# Patient Record
Sex: Male | Born: 1965 | Race: White | Hispanic: No | Smoking: Never smoker
Health system: Southern US, Community
[De-identification: ages and names within clinical notes are randomized; demographics above are authoritative.]

---

## 2016-02-28 ENCOUNTER — Emergency Department (HOSPITAL_BASED_OUTPATIENT_CLINIC_OR_DEPARTMENT_OTHER)
Admission: EM | Admit: 2016-02-28 | Discharge: 2016-02-28 | Disposition: A | Discharge status: Home / Self Care | Payer: BLUE CROSS/BLUE SHIELD | Attending: Emergency Medicine | Admitting: Emergency Medicine

## 2016-02-28 ENCOUNTER — Encounter (HOSPITAL_BASED_OUTPATIENT_CLINIC_OR_DEPARTMENT_OTHER): Payer: Self-pay | Admitting: *Deleted

## 2016-02-28 ENCOUNTER — Emergency Department (HOSPITAL_BASED_OUTPATIENT_CLINIC_OR_DEPARTMENT_OTHER): Payer: BLUE CROSS/BLUE SHIELD

## 2016-02-28 DIAGNOSIS — Y998 Other external cause status: Secondary | ICD-10-CM | POA: Insufficient documentation

## 2016-02-28 DIAGNOSIS — Z23 Encounter for immunization: Secondary | ICD-10-CM | POA: Insufficient documentation

## 2016-02-28 DIAGNOSIS — Y9389 Activity, other specified: Secondary | ICD-10-CM | POA: Insufficient documentation

## 2016-02-28 DIAGNOSIS — W228XXA Striking against or struck by other objects, initial encounter: Secondary | ICD-10-CM | POA: Insufficient documentation

## 2016-02-28 DIAGNOSIS — Y92480 Sidewalk as the place of occurrence of the external cause: Secondary | ICD-10-CM | POA: Diagnosis not present

## 2016-02-28 DIAGNOSIS — S80811A Abrasion, right lower leg, initial encounter: Secondary | ICD-10-CM | POA: Insufficient documentation

## 2016-02-28 DIAGNOSIS — S8991XA Unspecified injury of right lower leg, initial encounter: Secondary | ICD-10-CM | POA: Diagnosis present

## 2016-02-28 DIAGNOSIS — S70311A Abrasion, right thigh, initial encounter: Secondary | ICD-10-CM | POA: Insufficient documentation

## 2016-02-28 DIAGNOSIS — Y9289 Other specified places as the place of occurrence of the external cause: Secondary | ICD-10-CM | POA: Insufficient documentation

## 2016-02-28 MED ORDER — BACITRACIN ZINC 500 UNIT/GM EX OINT
1.0000 "application " | TOPICAL_OINTMENT | Freq: Once | CUTANEOUS | Status: AC
  Administered 2016-02-28: 1 via TOPICAL

## 2016-02-28 MED ORDER — TETANUS-DIPHTH-ACELL PERTUSSIS 5-2.5-18.5 LF-MCG/0.5 IM SUSP
0.5000 mL | Freq: Once | INTRAMUSCULAR | Status: AC
  Administered 2016-02-28: 0.5 mL via INTRAMUSCULAR
  Filled 2016-02-28: qty 0.5

## 2016-02-28 NOTE — Discharge Instructions (Signed)
Abrasion An abrasion is a cut or scrape on the outer surface of your skin. An abrasion does not extend through all of the layers of your skin. It is important to care for your abrasion properly to prevent infection. CAUSES Most abrasions are caused by falling on or gliding across the ground or another surface. When your skin rubs on something, the outer and inner layer of skin rubs off.  SYMPTOMS A cut or scrape is the main symptom of this condition. The scrape may be bleeding, or it may appear red or pink. If there was an associated fall, there may be an underlying bruise. DIAGNOSIS An abrasion is diagnosed with a physical exam. TREATMENT Treatment for this condition depends on how large and deep the abrasion is. Usually, your abrasion will be cleaned with water and mild soap. This removes any dirt or debris that may be stuck. An antibiotic ointment may be applied to the abrasion to help prevent infection. A bandage (dressing) may be placed on the abrasion to keep it clean. You may also need a tetanus shot. HOME CARE INSTRUCTIONS Medicines  Take or apply medicines only as directed by your health care provider.  If you were prescribed an antibiotic ointment, finish all of it even if you start to feel better. Wound Care  Clean the wound with mild soap and water 2-3 times per day or as directed by your health care provider. Pat your wound dry with a clean towel. Do not rub it.  There are many different ways to close and cover a wound. Follow instructions from your health care provider about:  Wound care.  Dressing changes and removal.  Check your wound every day for signs of infection. Watch for:  Redness, swelling, or pain.  Fluid, blood, or pus. General Instructions  Keep the dressing dry as directed by your health care provider. Do not take baths, swim, use a hot tub, or do anything that would put your wound underwater until your health care provider approves.  If there is  swelling, raise (elevate) the injured area above the level of your heart while you are sitting or lying down.  Keep all follow-up visits as directed by your health care provider. This is important. SEEK MEDICAL CARE IF:  You received a tetanus shot and you have swelling, severe pain, redness, or bleeding at the injection site.  Your pain is not controlled with medicine.  You have increased redness, swelling, or pain at the site of your wound. SEEK IMMEDIATE MEDICAL CARE IF:  You have a red streak going away from your wound.  You have a fever.  You have fluid, blood, or pus coming from your wound.  You notice a bad smell coming from your wound or your dressing.   This information is not intended to replace advice given to you by your health care provider. Make sure you discuss any questions you have with your health care provider.   Document Released: 09/17/2005 Document Revised: 08/29/2015 Document Reviewed: 12/06/2014 Elsevier Interactive Patient Education 2016 [employer]  

## 2016-02-28 NOTE — ED Notes (Signed)
Pt has normal movement of RLE and able to move foot w/o difficulty, normal sensation and color to Rt lower extremity as well

## 2016-02-28 NOTE — ED Notes (Signed)
Pt teaching done re: to strict handwashing, keeping dsg clean dry and intact. Also provided pt with dsg supplies to help at home. Discussed times when to return to ED

## 2016-02-28 NOTE — ED Notes (Signed)
Dsg applied to rt calf area with Telfa pads and Kerlix, to rt medial malleolus with Kerlix and non-adhesive bandage, dsg also applied to rt medial ankle injury as well with Kerlix and non-adhesive pad also.
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
## 2016-02-28 NOTE — ED Notes (Signed)
Pt returns from radiology. 

## 2016-02-28 NOTE — ED Notes (Signed)
Patient transported to X-ray 

## 2016-02-28 NOTE — ED Notes (Signed)
MD at bedside. 
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
## 2016-12-26 ENCOUNTER — Emergency Department (HOSPITAL_BASED_OUTPATIENT_CLINIC_OR_DEPARTMENT_OTHER): Payer: BLUE CROSS/BLUE SHIELD

## 2016-12-26 ENCOUNTER — Encounter (HOSPITAL_BASED_OUTPATIENT_CLINIC_OR_DEPARTMENT_OTHER): Payer: Self-pay | Admitting: Emergency Medicine

## 2016-12-26 ENCOUNTER — Emergency Department (HOSPITAL_BASED_OUTPATIENT_CLINIC_OR_DEPARTMENT_OTHER)
Admission: EM | Admit: 2016-12-26 | Discharge: 2016-12-26 | Disposition: A | Discharge status: Home / Self Care | Payer: BLUE CROSS/BLUE SHIELD | Attending: Emergency Medicine | Admitting: Emergency Medicine

## 2016-12-26 DIAGNOSIS — J069 Acute upper respiratory infection, unspecified: Secondary | ICD-10-CM | POA: Insufficient documentation

## 2016-12-26 DIAGNOSIS — B9789 Other viral agents as the cause of diseases classified elsewhere: Secondary | ICD-10-CM

## 2016-12-26 DIAGNOSIS — R05 Cough: Secondary | ICD-10-CM | POA: Diagnosis present

## 2016-12-26 MED ORDER — BENZONATATE 100 MG PO CAPS
100.0000 mg | ORAL_CAPSULE | Freq: Once | ORAL | Status: AC
  Administered 2016-12-26: 100 mg via ORAL
  Filled 2016-12-26: qty 1

## 2016-12-26 MED ORDER — BENZONATATE 100 MG PO CAPS: 100.0000 mg | ORAL_CAPSULE | Freq: Three times a day (TID) | ORAL | 0 refills | Status: AC

## 2016-12-26 MED ORDER — FLUTICASONE PROPIONATE 50 MCG/ACT NA SUSP: 2.0000 | Freq: Every day | NASAL | 0 refills | Status: AC

## 2016-12-26 NOTE — ED Provider Notes (Signed)
[facility]   CSN: [id number] Arrival date & time: 12/26/16  0941     History   Chief Complaint Chief Complaint  Patient presents with  . Cough    HPI Benjamin Atkins is a 51 y.o. male.  51 year old Caucasian male with no significant past medical history presents to the ED today with multiple complaints. Patient states that approximate 5 days ago he woke up with body aches, fever, chills. States that he took Alka-Seltzer cold and flu and felt like he broke his fever. Patient also endorses rhinorrhea, sore throat, productive cough. States that he was feeling improved until this morning when his temperature was 100.2. He is concerned that he has a sinus infection thinks that he needs antibiotics. Patient endorses sick contacts. States that he is coughing up yellow-brown sputum. Nasal discharge is clear to yellow. He reports sinus pressure. Denies any headache, vision changes, neck pain, lightheadedness, dizziness, shortness of breath, chest pain, nausea, emesis, abdominal pain. Patient denies getting a flu shot this year. Denies being a tobacco user.      History reviewed. No pertinent past medical history.  There are no active problems to display for this patient.   History reviewed. No pertinent surgical history.     Home Medications    Prior to Admission medications   Not on File    Family History No family history on file.  Social History Social History  Substance Use Topics  . Smoking status: Never Smoker  . Smokeless tobacco: Never Used  . Alcohol use No     Allergies   Patient has no known allergies.   Review of Systems Review of Systems  Constitutional: Positive for chills and fever.  HENT: Positive for congestion, postnasal drip, rhinorrhea, sinus pressure and sore throat.   Eyes: Negative for visual disturbance.  Respiratory: Positive for cough. Negative for shortness of breath and wheezing.   Cardiovascular: Negative for  chest pain.  Gastrointestinal: Negative for abdominal pain, diarrhea, nausea and vomiting.  Musculoskeletal: Negative.   Neurological: Negative for dizziness, weakness, light-headedness, numbness and headaches.  All other systems reviewed and are negative.    Physical Exam Updated Vital Signs BP 143/78 (BP Location: Left Arm)   Pulse 95   Temp 98.1 F (36.7 C) (Oral)   Resp 18   Ht 6' (1.829 m)   Wt 84.8 kg   SpO2 100%   BMI 25.36 kg/m   Physical Exam  Constitutional: He appears well-developed and well-nourished. No distress.  HENT:  Head: Normocephalic and atraumatic.  Right Ear: Tympanic membrane, external ear and ear canal normal.  Left Ear: Tympanic membrane, external ear and ear canal normal.  Nose: Mucosal edema and rhinorrhea present. Right sinus exhibits no maxillary sinus tenderness and no frontal sinus tenderness. Left sinus exhibits no maxillary sinus tenderness and no frontal sinus tenderness.  Mouth/Throat: Uvula is midline and mucous membranes are normal. Posterior oropharyngeal erythema present. No oropharyngeal exudate, posterior oropharyngeal edema or tonsillar abscesses. Tonsils are 0 on the right. Tonsils are 0 on the left. No tonsillar exudate.  Eyes: Conjunctivae are normal. Pupils are equal, round, and reactive to light. Right eye exhibits no discharge. Left eye exhibits no discharge. No scleral icterus.  Neck: Normal range of motion. Neck supple. No thyromegaly present.  Cardiovascular: Normal rate, regular rhythm, normal heart sounds and intact distal pulses.  Exam reveals no gallop and no friction rub.   No murmur heard. Pulmonary/Chest: Effort normal and breath sounds normal.  Lungs  clear to auscultation bilaterally  Musculoskeletal: Normal range of motion.  Lymphadenopathy:    He has no cervical adenopathy.  Neurological: He is alert.  Skin: Skin is warm and dry. Capillary refill takes less than 2 seconds.  Nursing note and vitals reviewed.    ED  Treatments / Results  Labs (all labs ordered are listed, but only abnormal results are displayed) Labs Reviewed - No data to display  EKG  EKG Interpretation None       Radiology Dg Chest 2 View  Result Date: 12/26/2016 CLINICAL DATA:  51 year old male with productive cough and fever EXAM: CHEST  2 VIEW COMPARISON:  None. FINDINGS: The lungs are clear and negative for focal airspace consolidation, pulmonary edema or suspicious pulmonary nodule. No pleural effusion or pneumothorax. Cardiac and mediastinal contours are within normal limits. No acute fracture or lytic or blastic osseous lesions. The visualized upper abdominal bowel gas pattern is unremarkable. IMPRESSION: Negative chest x-ray. Electronically Signed   By: Malachy Moan M.D.   On: 12/26/2016 10:16    Procedures Procedures (including critical care time)  Medications Ordered in ED Medications  benzonatate (TESSALON) capsule 100 mg (not administered)     Initial Impression / Assessment and Plan / ED Course  I have reviewed the triage vital signs and the nursing notes.  Pertinent labs & imaging results that were available during my care of the patient were reviewed by me and considered in my medical decision making (see chart for details).  Clinical Course   Pt CXR negative for acute infiltrate. Patients symptoms are consistent with URI, likely viral etiology. Denies sob or cp. Doubt ACS or PE.  Discussed that antibiotics are not indicated for viral infections. Influenza possible diagnoses. Symptoms have been ongoing for 5 days. Out of the window for Tamiflu. Discussed that flu swab was not indicated. Stable VS.  Patient is nontoxic appearing. Pt will be discharged with symptomatic treatment.  Verbalizes understanding and is agreeable with plan. Pt is hemodynamically stable & in NAD prior to dc.   Final Clinical Impressions(s) / ED Diagnoses   Final diagnoses:  Viral URI with cough    New Prescriptions New  Prescriptions   BENZONATATE (TESSALON) 100 MG CAPSULE    Take 1 capsule (100 mg total) by mouth every 8 (eight) hours.   FLUTICASONE (FLONASE) 50 MCG/ACT NASAL SPRAY    Place 2 sprays into both nostrils daily.     Rise Mu, PA-C 12/26/16 1041    Alvira Monday, MD 12/27/16 [phone]

## 2016-12-26 NOTE — ED Triage Notes (Signed)
Onset x 5 days  Fever   Cough..productive

## 2016-12-26 NOTE — Discharge Instructions (Signed)
Your x-ray does not show any signs of pneumonia. This is likely a viral infection and antibiotics are not indicated at this time. If your symptoms last for longer than 10 days need to be reevaluated for possible need for antibiotics. Please take the Tessalon for cough. Use the Flonase for nasal congestion. I would also recommend getting over-the-counter Mucinex DM with a nasal decongestant. Follow-up with her primary care doctor if her symptoms do not improve. Return to ED if he develops any worsening symptoms.

## 2017-06-06 IMAGING — CR DG CHEST 2V
2 series · 2 of 2 positions shown · non-contrast
Comparison: None.

CLINICAL DATA: 50-year-old male with productive cough and fever

EXAM:
CHEST  2 VIEW

[w chest pa]
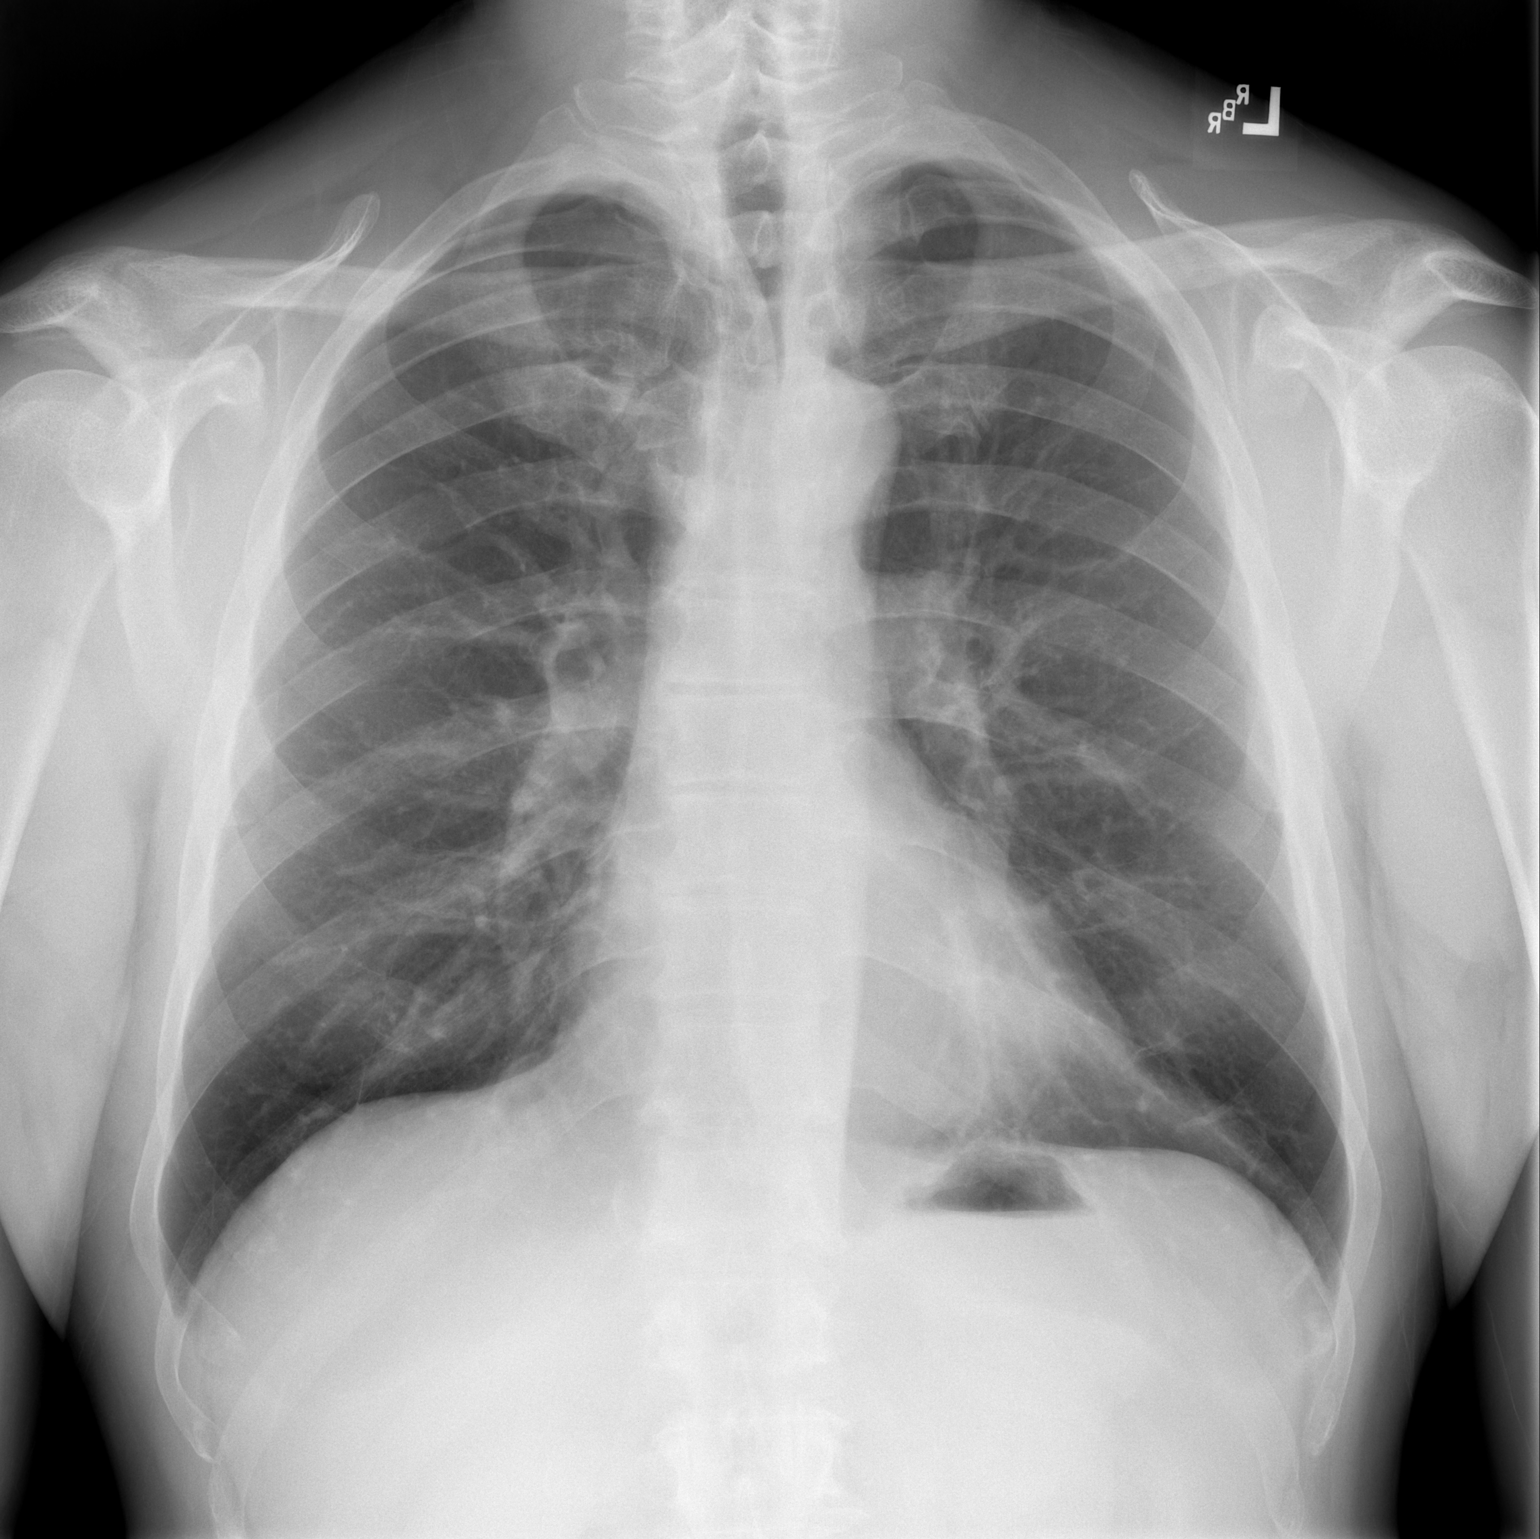

[w chest lat]
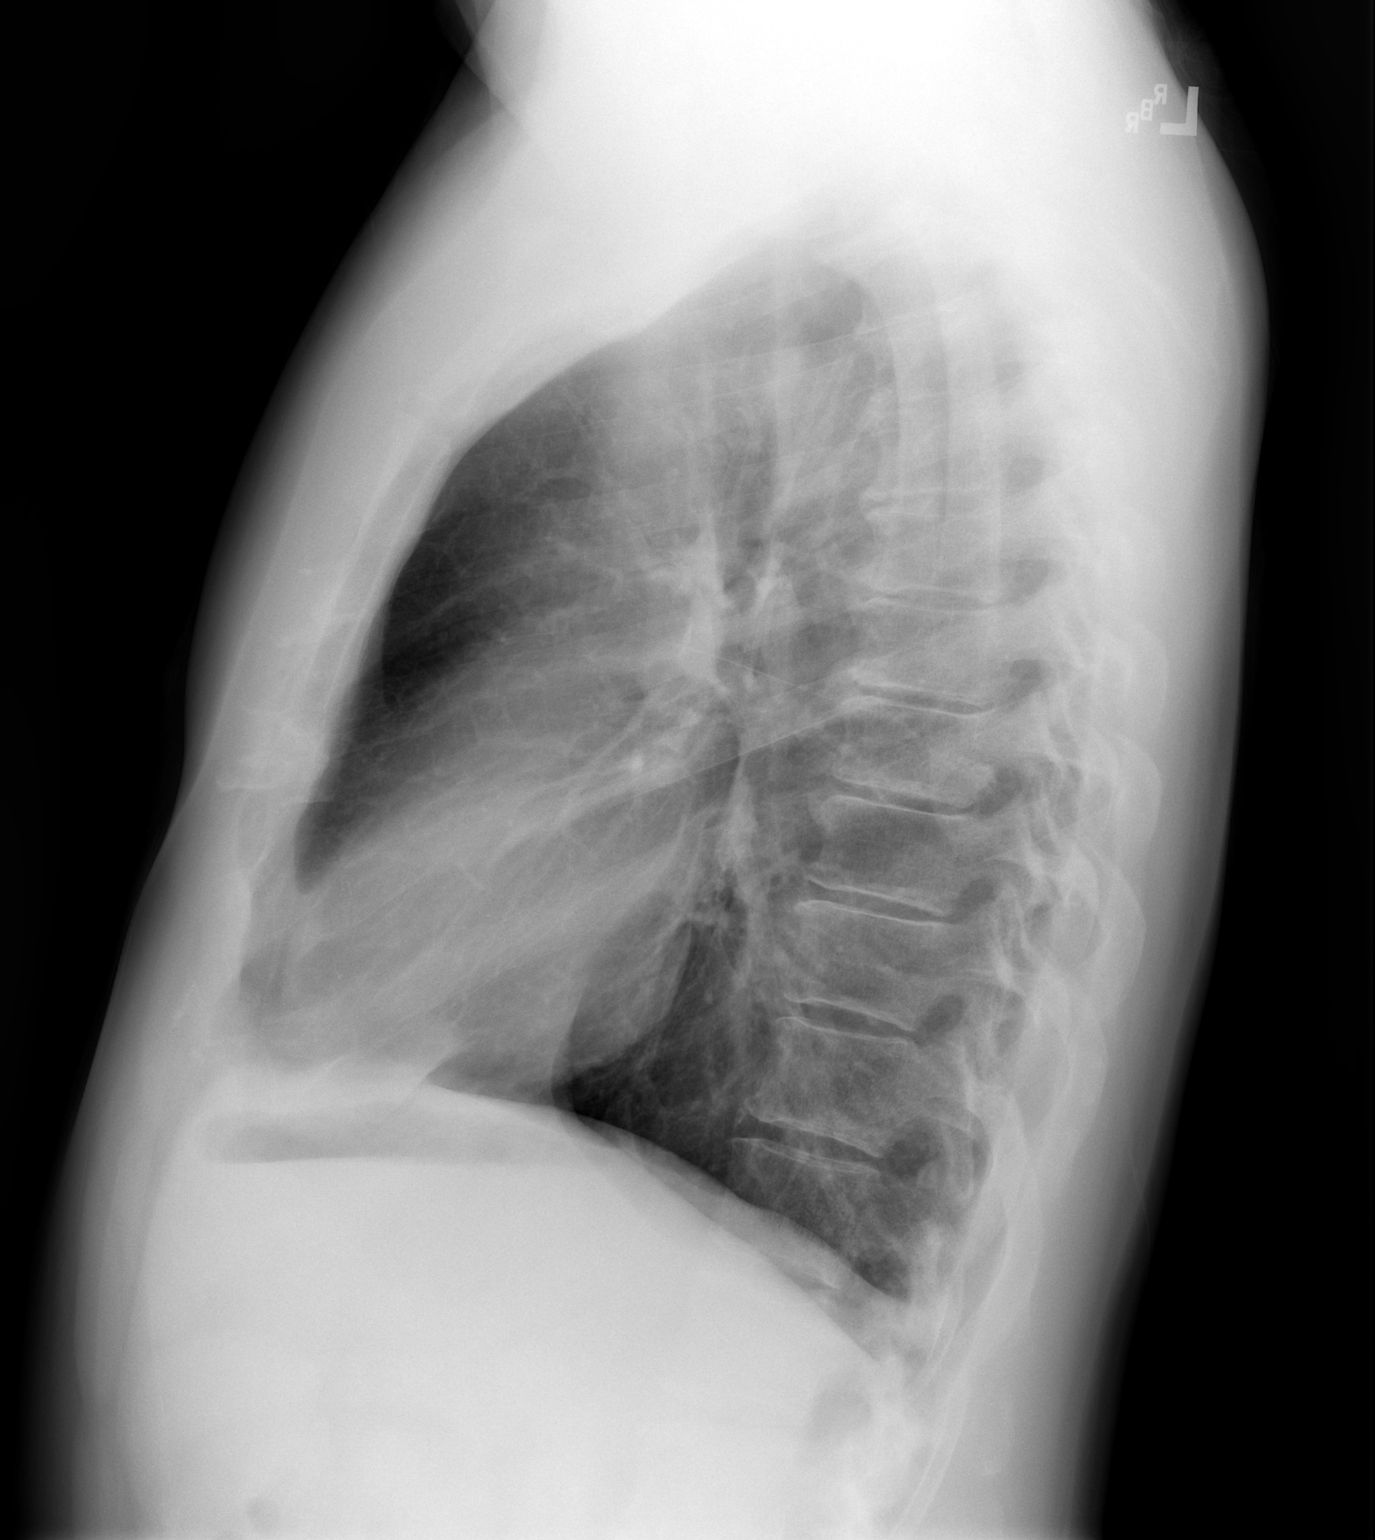

[2 of 2 positions shown; findings below may reference images not displayed]

FINDINGS: The lungs are clear and negative for focal airspace consolidation,
pulmonary edema or suspicious pulmonary nodule. No pleural effusion
or pneumothorax. Cardiac and mediastinal contours are within normal
limits. No acute fracture or lytic or blastic osseous lesions. The
visualized upper abdominal bowel gas pattern is unremarkable.
IMPRESSION: Negative chest x-ray.
